# Patient Record
Sex: Female | Born: 1990 | Race: White | Hispanic: No | Marital: Single | Smoking: Never smoker
Health system: Southern US, Community
[De-identification: ages and names within clinical notes are randomized; demographics above are authoritative.]

---

## 2014-11-10 ENCOUNTER — Ambulatory Visit (INDEPENDENT_AMBULATORY_CARE_PROVIDER_SITE_OTHER): Payer: BC Managed Care – PPO | Admitting: Family Medicine

## 2014-11-10 ENCOUNTER — Other Ambulatory Visit: Payer: Self-pay | Admitting: Family Medicine

## 2014-11-10 ENCOUNTER — Telehealth: Payer: Self-pay | Admitting: *Deleted

## 2014-11-10 ENCOUNTER — Ambulatory Visit
Admission: RE | Admit: 2014-11-10 | Discharge: 2014-11-10 | Disposition: A | Discharge status: Home / Self Care | Payer: BC Managed Care – PPO | Source: Ambulatory Visit | Attending: Family Medicine | Admitting: Family Medicine

## 2014-11-10 ENCOUNTER — Encounter: Payer: Self-pay | Admitting: Family Medicine

## 2014-11-10 VITALS — BP 117/72 | HR 81 | Ht 64.0 in | Wt 130.0 lb

## 2014-11-10 DIAGNOSIS — M79672 Pain in left foot: Secondary | ICD-10-CM

## 2014-11-10 DIAGNOSIS — M84375A Stress fracture, left foot, initial encounter for fracture: Secondary | ICD-10-CM

## 2014-11-10 NOTE — Progress Notes (Signed)
  April Hebert - 24 y.o. female MRN [id number]  Date of birth: [birth year]  CC: No chief complaint on file.   SUBJECTIVE:   HPI  Began running in February.  10-15 miles a week.  New runner. Running mostly on the treadmill. Also swims - Pain on dorsum of left foot began in May.  - Massage in July made the pain much worse - Icing, elevating, and resting for the last month with minimal improvement. - Numbness on occasion when wearing shoes. - Noticeably swollen, although much better than last month - Now swimming w/o pain.   - Pain is in midfoot and does not radiate to her toes. - Feels better with month of rest, except when touched. Still having pain with walking and standing.  ROS:     14 point review of systems reviewed and otherwise negative.  HISTORY: Past Medical, Surgical, Social, and Family History Reviewed & Updated per EMR.  Pertinent Historical Findings include: Negative  OBJECTIVE: BP 117/72 mmHg  Pulse 81  Ht  (1.626 m)  Wt 130 lb (58.968 kg)  BMI 22.30 kg/m2  Physical Exam  Gen: Calm no distress sitting comfortably on table.  Respiratory: Speaking in full sentences, nonlabored breathing. Left foot exam:  - Left foot is visibly swollen as compared to the right. Swelling is located on the dorsum of the foot and tracks laterally as well. - No erythema or warmth identified - Patient is tender over the second metatarsal base as well as the first and second interdigital space.  - Negative squeeze test.  - Strength is 5 out of 5 with dorsiflexion, plantarflexion, inversion, eversion. - Neurovascularly intact distally - Stance appears unremarkable with no pes planus or pes cavus Skin: No rash or lesion identified.   Imaging: [place] image of the proximal left second metatarsal in both long and short axis obtained. There is no cortical irregularity of the first second or third metatarsal. Overlying the proximal second metatarsal there is a hypoechoic region adjacent to the  bone which is at the point of maximal tenderness. This is identifying both short and long axis. This suggest a stress reaction.  MEDICATIONS, LABS & OTHER ORDERS: Previous Medications   CEPHALEXIN (KEFLEX) 500 MG CAPSULE    Take 500 mg by mouth.   CLOBETASOL CREAM (TEMOVATE) 0.05 %    Apply topically.   PREDNISONE (DELTASONE) 20 MG TABLET    Take 3 tablets x 3 days, 2 tablets x 3 days, then 1 tablet x 3 days   Modified Medications   No medications on file   New Prescriptions   No medications on file   Discontinued Medications   No medications on file  No orders of the defined types were placed in this encounter.   ASSESSMENT & PLAN: See problem based charting & AVS for pt instructions.

## 2014-11-10 NOTE — Telephone Encounter (Signed)
Spoke with pt about xray results. She will wear the post op shoe until her next appt with [place].

## 2014-11-10 NOTE — Telephone Encounter (Signed)
-----   Message from Guinevere Scarlet, MD sent at 11/10/2014  3:04 PM EDT ----- I just tried calling this patient and no answer. Would you mind calling her?   Left foot XR negative.  Diagnosed with a stress reaction today.  She should continue wearing the post-op shoe up until her follow up appointment in 3 weeks (not while sleeping).    Thanks! Harrison Mons

## 2014-11-11 DIAGNOSIS — M84375A Stress fracture, left foot, initial encounter for fracture: Secondary | ICD-10-CM | POA: Insufficient documentation

## 2014-11-11 NOTE — Assessment & Plan Note (Signed)
April Hebert is a healthy 24 year old female who presents with 3 months of left foot pain after she took up running for the first time. She was running 15 miles weekly for several months. Despite one month of rest she is still having significant pain with standing and walking, although it has improved. She also has significant edema over the dorsum of her foot. Patient is tender over the left second metatarsal base and there is edema identified on ultrasound in this area.  All of this suggest a stress reaction. - X-ray 2 view of the left foot to rule out obvious fracture. - Provided with a postop shoe to be worn whenever walking until next follow-up. - Patient can continue to swim or bike if that does not cause her pain. - We may the consider checking a vitamin D level at follow-up. - Follow up in 3 weeks to be reevaluated.

## 2014-12-01 ENCOUNTER — Ambulatory Visit (INDEPENDENT_AMBULATORY_CARE_PROVIDER_SITE_OTHER): Payer: BC Managed Care – PPO | Admitting: Family Medicine

## 2014-12-01 ENCOUNTER — Encounter: Payer: Self-pay | Admitting: Family Medicine

## 2014-12-01 VITALS — BP 119/78 | Ht 64.0 in | Wt 130.0 lb

## 2014-12-01 DIAGNOSIS — M84375A Stress fracture, left foot, initial encounter for fracture: Secondary | ICD-10-CM | POA: Diagnosis not present

## 2014-12-01 NOTE — Progress Notes (Signed)
  April Hebert - 24 y.o. female MRN [id number]  Date of birth: 1991-01-26  CC: Left Foot pain  SUBJECTIVE:   HPI  Began running in February. 10-15 miles a week. New runner. Running mostly on the treadmill. Also swims. Last seen here ~3 weeks ago and diagnosed with a stress rxn of at the base of the 2nd left MT.   - Pain obegan in May.  - Has been wearing the post-op shoe for the last month.  No pain while wearing the shoe.  - Did remove the shoe this past weekend and had swelling, but no pain.   - Now swimming w/o pain.  - Pain is in midfoot and does not radiate to her toes.   ROS:     14 point review of systems reviewed and otherwise negative.  HISTORY: Past Medical, Surgical, Social, and Family History Reviewed & Updated per EMR.  Pertinent Historical Findings include:Negative  OBJECTIVE: BP 119/78 mmHg  Ht  (1.626 m)  Wt 130 lb (58.968 kg)  BMI 22.30 kg/m2  LMP 10/20/2014  Physical Exam  Gen: Calm no distress sitting comfortably on table.  Respiratory: Speaking in full sentences, nonlabored breathing. Left foot exam:  - Left foot less swollen than at previous visit. Swelling is located on the dorsum of the foot and tracks laterally as well. - No erythema or warmth identified - Patient is minmally tender over the second metatarsal base as well as the first and second interdigital space.  - Negative squeeze test.  - Strength is 5 out of 5 with dorsiflexion, plantarflexion, inversion, eversion. - Neurovascularly intact distally Skin: No rash or lesion identified.  Imaging: [place] image of the proximal left second metatarsal in both long and short axis obtained. There is no cortical irregularity of the first second or third metatarsal. Overlying the proximal second metatarsal there is a hypoechoic region adjacent to the bone which is at the point of maximal tenderness. This is identified in both short and long axis. This suggests a stress reaction.  MEDICATIONS, LABS &  OTHER ORDERS: Previous Medications   CEPHALEXIN (KEFLEX) 500 MG CAPSULE    Take 500 mg by mouth.   CLOBETASOL CREAM (TEMOVATE) 0.05 %    Apply topically.   PREDNISONE (DELTASONE) 20 MG TABLET    Take 3 tablets x 3 days, 2 tablets x 3 days, then 1 tablet x 3 days   Modified Medications   No medications on file   New Prescriptions   No medications on file   Discontinued Medications   No medications on file  No orders of the defined types were placed in this encounter.   ASSESSMENT & PLAN: See problem based charting & AVS for pt instructions.

## 2014-12-02 NOTE — Assessment & Plan Note (Addendum)
April Hebert is a healthy 24 yo female with several months of left foot pain after starting a running regime.  She has been in a post-op shoe for the most of the last 3 weeks.  She is feeling better, no pain while in shoe or this past weekend when she came out of the shoe.  No change on u/s findings in terms of swelling adjacent to the bone.  Again her history is consistent with a stress rxn.   - Continue to swimming. Okay to bike as well.  - Discussed checking 25-OH vitamin D level. She will determine the price from her insurance.  - She is okay to come out of the post-op shoe.  She should use pain as her guide and go back into it if she develops pain.   - We have also provided her with an arch strap.   - f/u in 3-4 weeks.

## 2014-12-22 ENCOUNTER — Ambulatory Visit: Payer: BC Managed Care – PPO | Admitting: Family Medicine

## 2016-05-16 IMAGING — CR DG FOOT COMPLETE 3+V*L*
3 series · 3 of 3 positions shown · non-contrast
Comparison: None.

CLINICAL DATA: Distal third metacarpal pain

EXAM:
LEFT FOOT - COMPLETE 3+ VIEW

[t foot ap left]
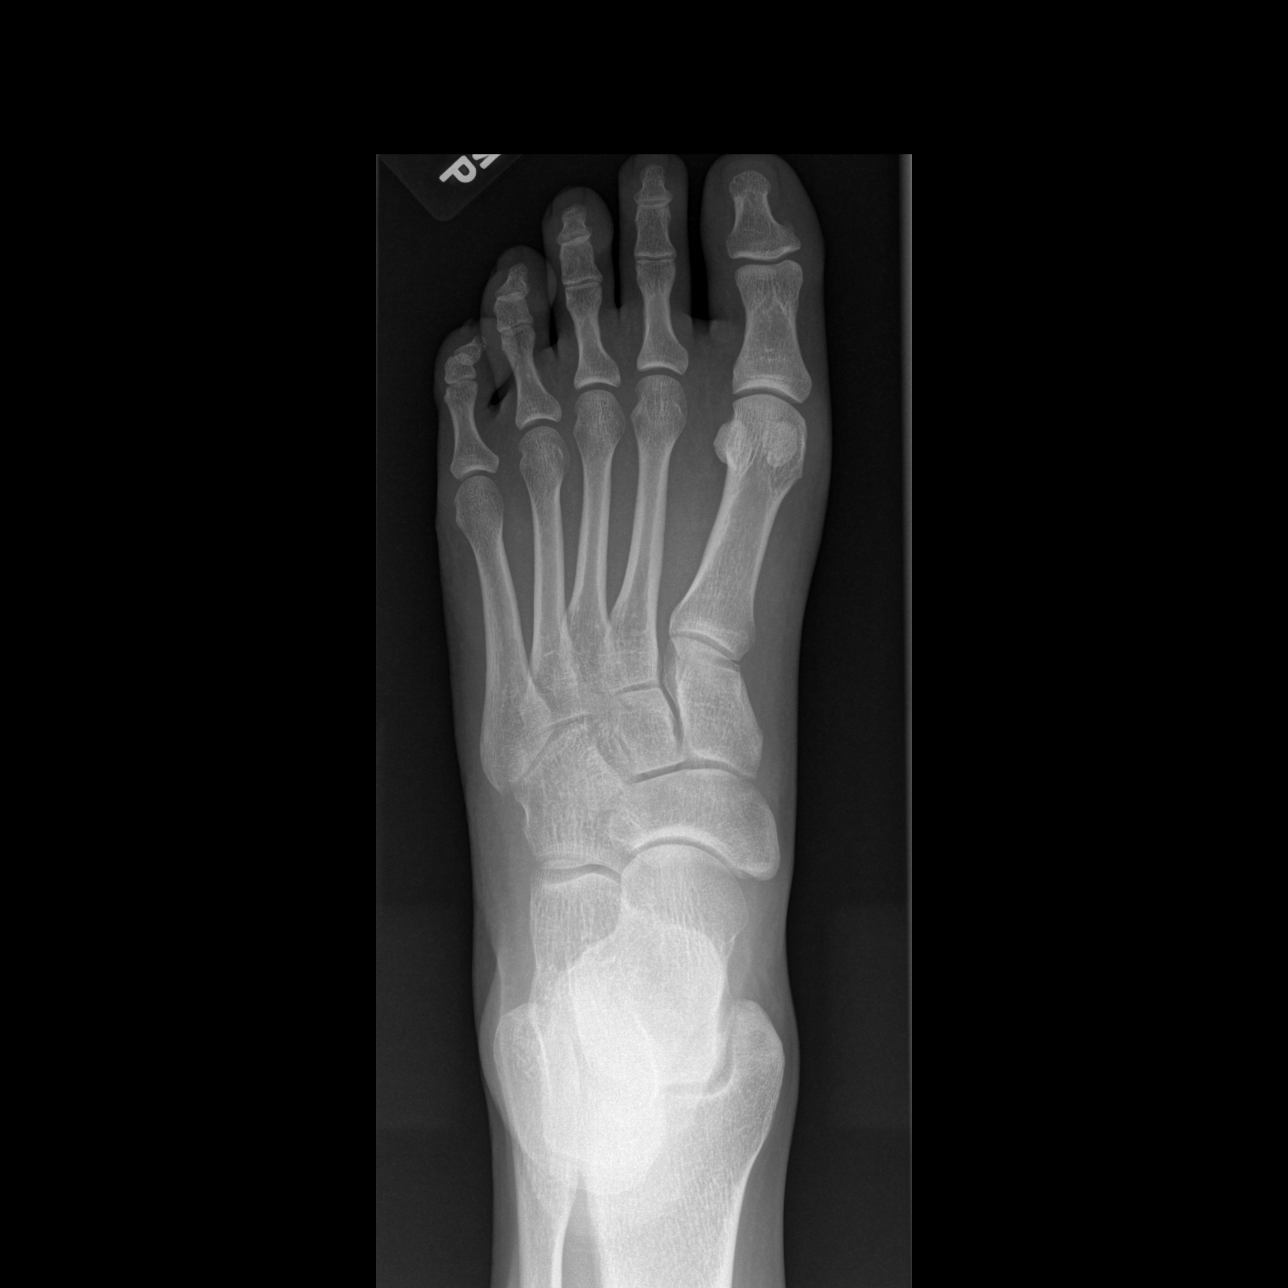

[t foot oblique left]
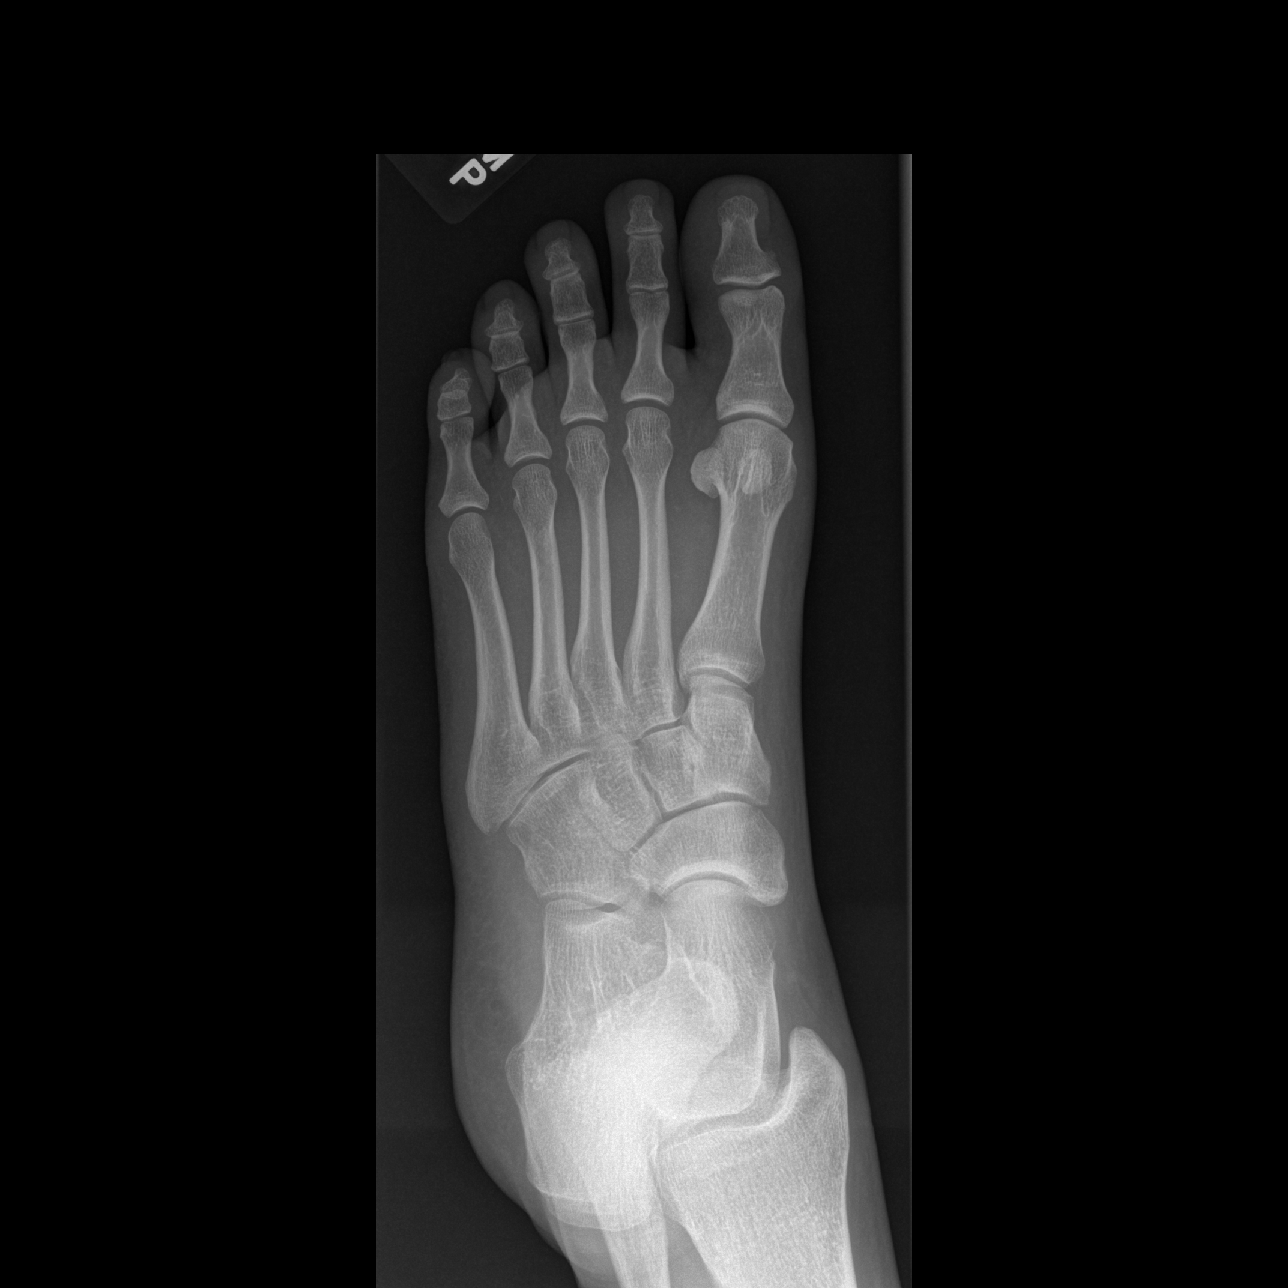

[t foot lat left]
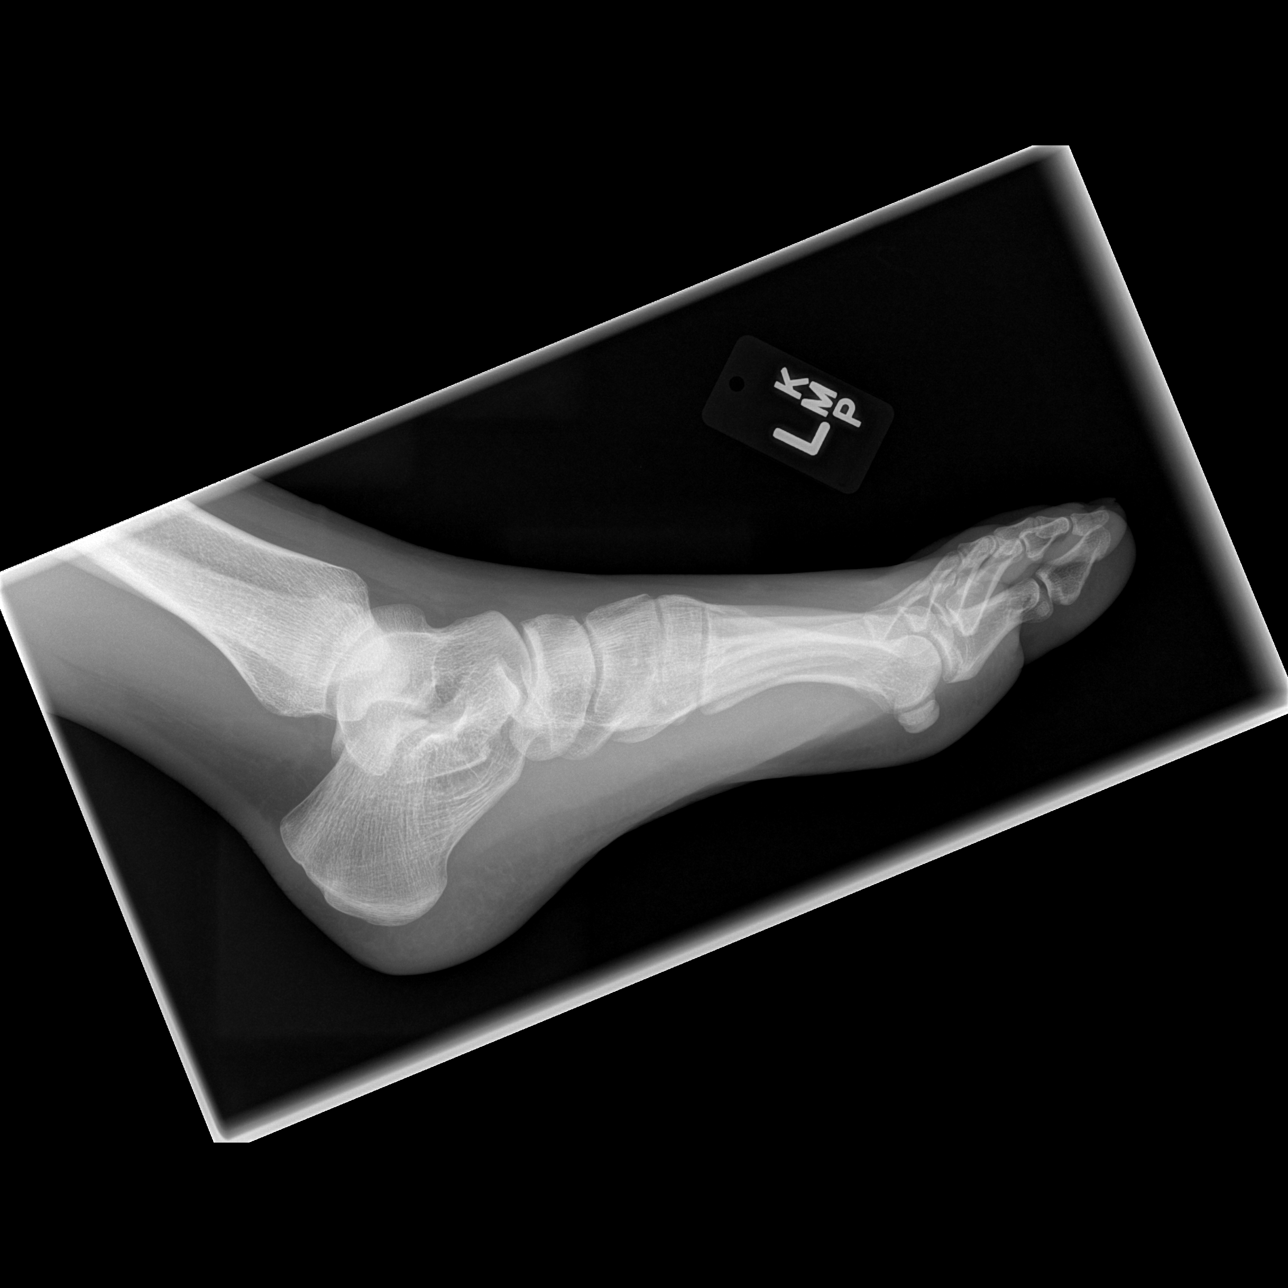

[3 of 3 positions shown; findings below may reference images not displayed]

FINDINGS: Three views of left foot submitted. No acute fracture or
subluxation. No radiopaque foreign body.
IMPRESSION: No acute fracture or subluxation.  No radiopaque foreign body.
